# Patient Record
Sex: Male | Born: 1980 | Race: White | Hispanic: No | Marital: Single | Smoking: Current some day smoker
Health system: Southern US, Community
[De-identification: ages and names within clinical notes are randomized; demographics above are authoritative.]

## PROBLEM LIST (undated history)

## (undated) DIAGNOSIS — N200 Calculus of kidney: Secondary | ICD-10-CM

---

## 2004-09-05 ENCOUNTER — Emergency Department: Payer: Self-pay | Admitting: Internal Medicine

## 2004-09-23 ENCOUNTER — Ambulatory Visit: Payer: Self-pay | Admitting: General Practice

## 2004-10-22 ENCOUNTER — Ambulatory Visit: Payer: Self-pay | Admitting: General Practice

## 2006-01-29 ENCOUNTER — Emergency Department: Payer: Self-pay | Admitting: Unknown Physician Specialty

## 2007-03-14 IMAGING — CT CT STONE STUDY
1 of 2 series · 15 of 32 positions shown, 19 images · non-contrast
Comparison: none

REASON FOR EXAM: left flank pain///pt in subwait
COMMENTS:

[Series 2: stone · axial · 0.69mm/px · z∈[-834,-444]mm · 15 of 147 slices shown, 19 images]
[im 11/147  soft-tissue]
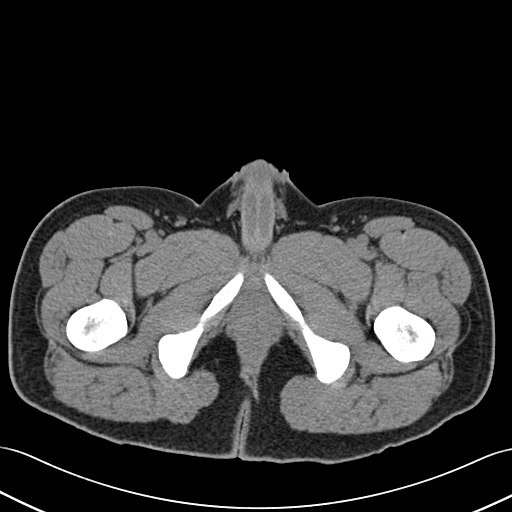
[im 11/147  bone]
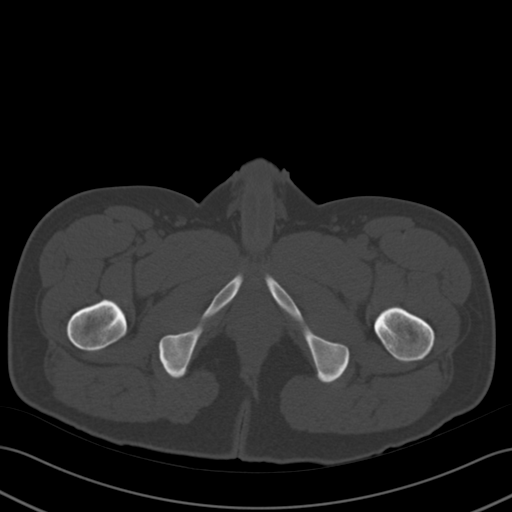
[im 21/147  soft-tissue]
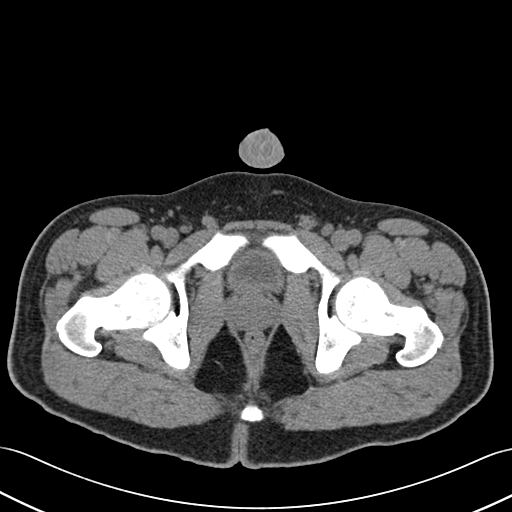
[im 32/147  soft-tissue]
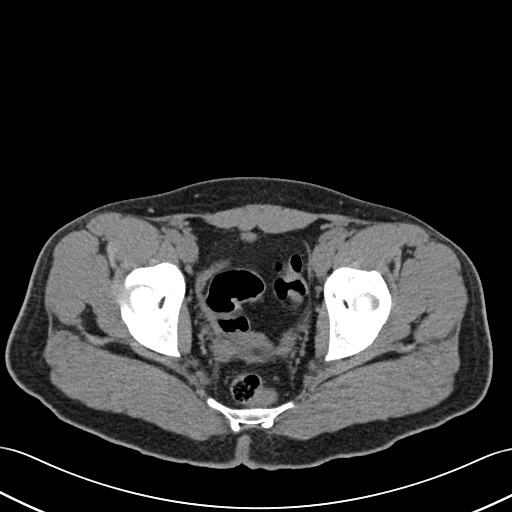
[im 42/147  soft-tissue]
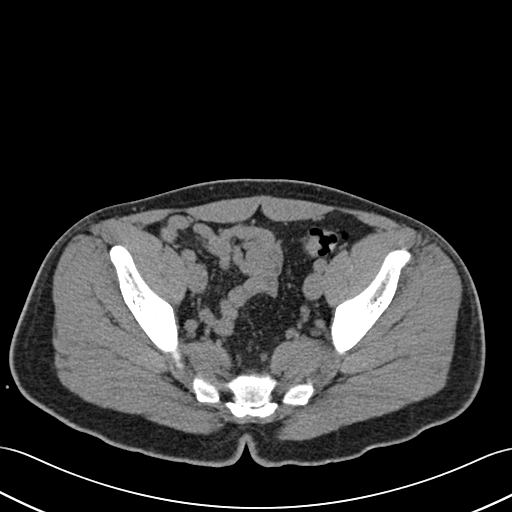
[im 53/147  soft-tissue]
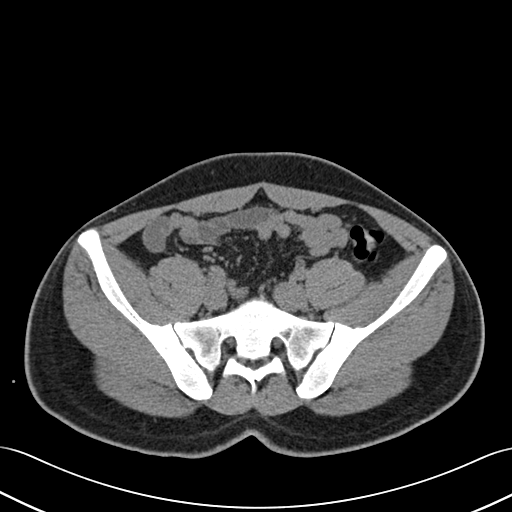
[im 63/147  soft-tissue]
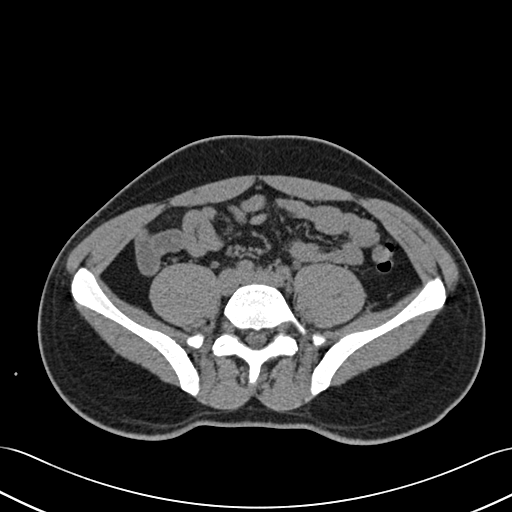
[im 74/147  soft-tissue]
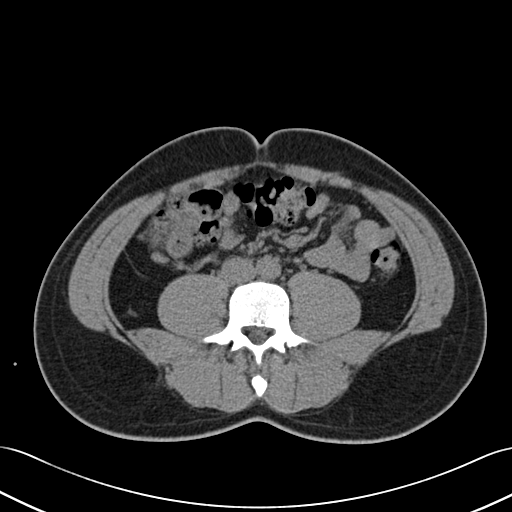
[im 84/147  soft-tissue]
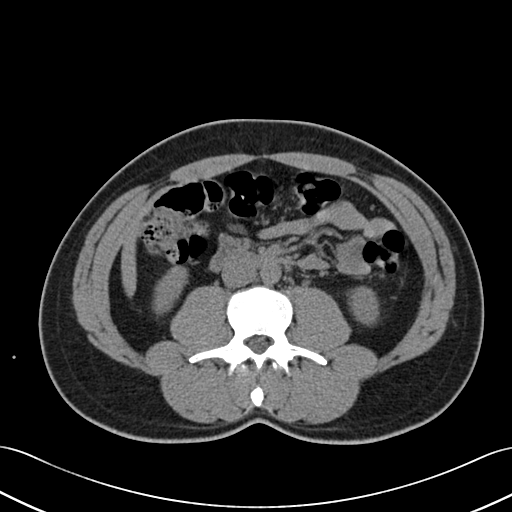
[im 94/147  soft-tissue]
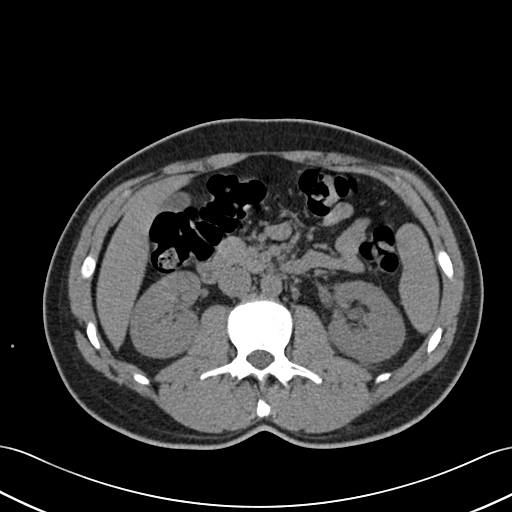
[im 94/147  bone]
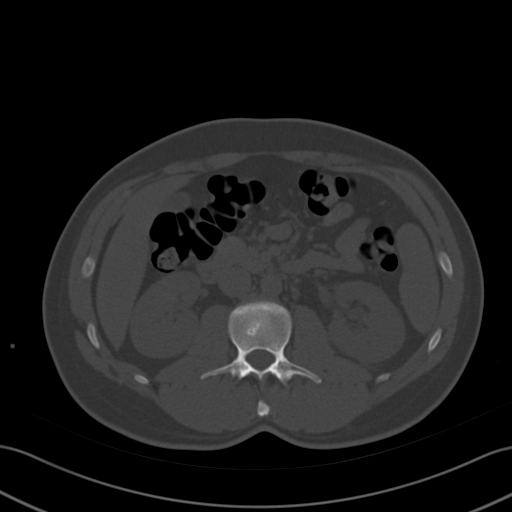
[im 105/147  soft-tissue]
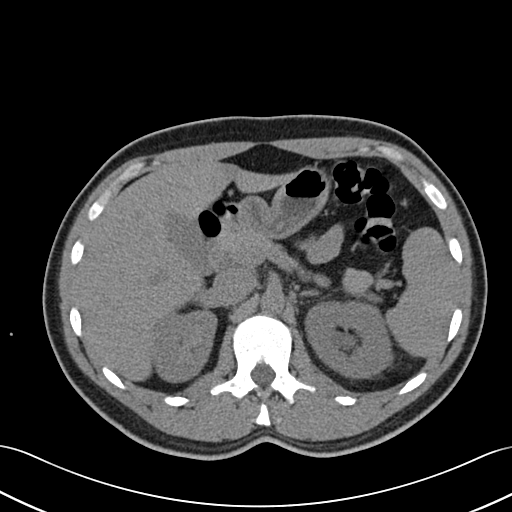
[im 115/147  soft-tissue]
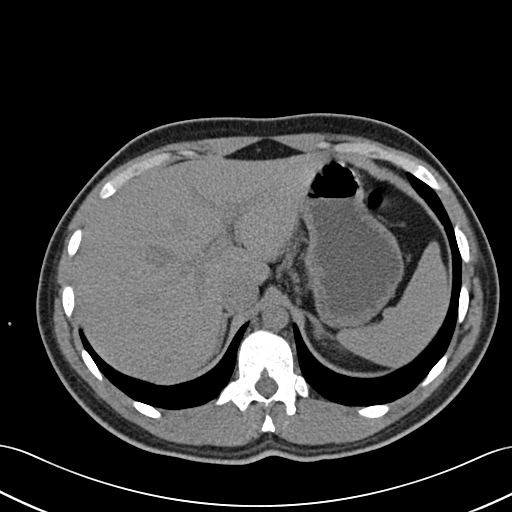
[im 126/147  soft-tissue]
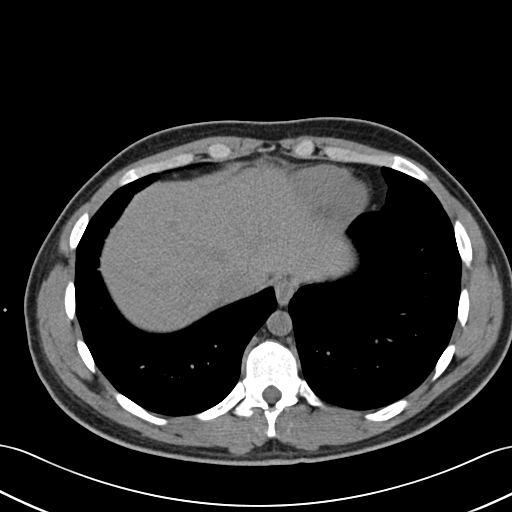
[im 126/147  lung]
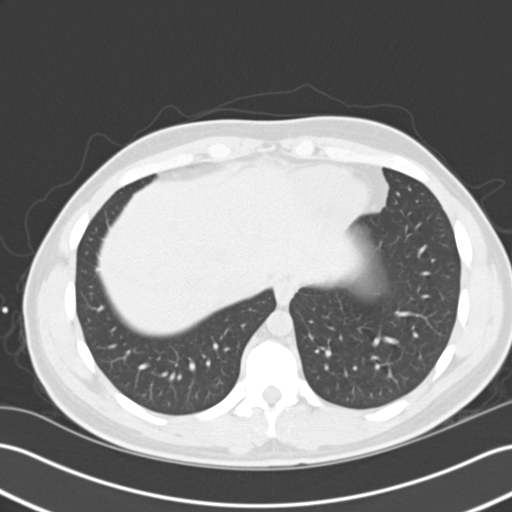
[im 131/147  lung]
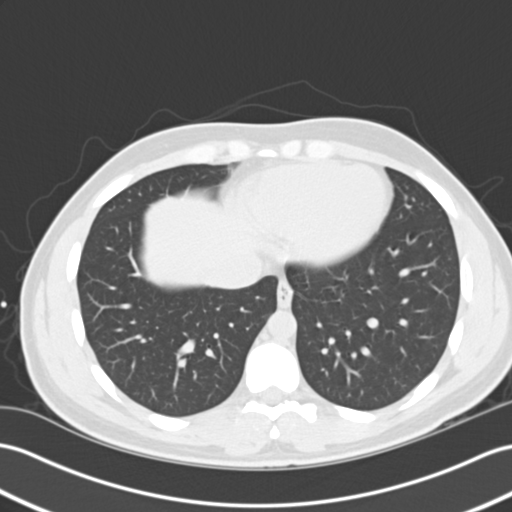
[im 136/147  soft-tissue]
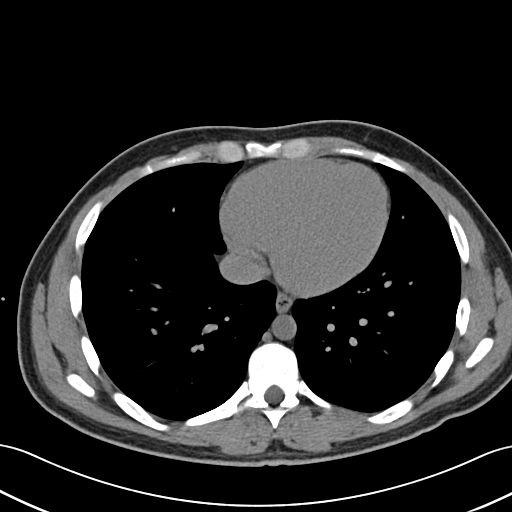
[im 136/147  lung]
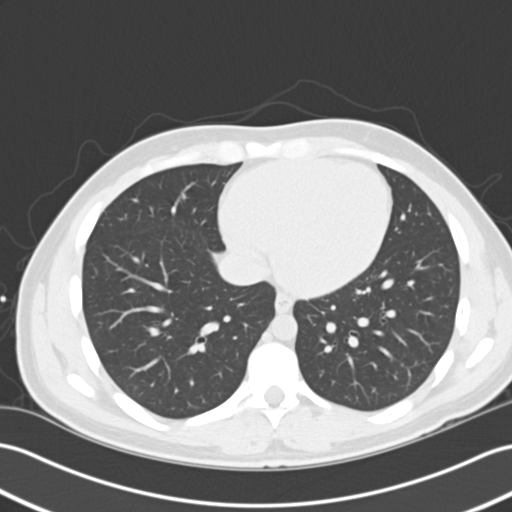
[im 141/147  lung]
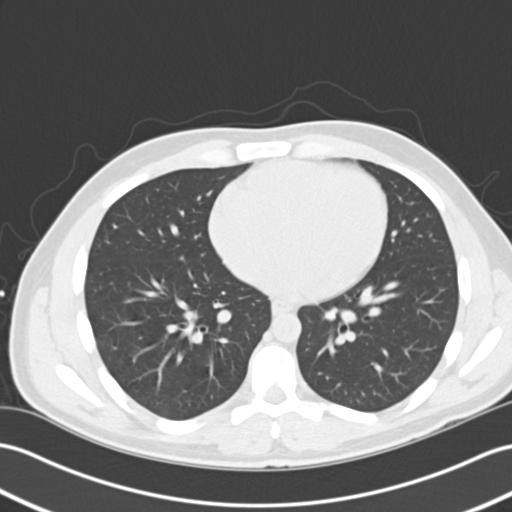

[15 of 32 positions shown; findings below may reference images not displayed]

PROCEDURE:     CT  - CT ABDOMEN /PELVIS WO (STONE)  - January 29, 2006  [DATE]

RESULT:     Helical non-contrast at 3 mm sections were obtained from the
lung bases through the pubic symphysis.

Evaluation of the lung bases demonstrates no gross abnormalities. Evaluation
of the LEFT kidney demonstrates pelvocaliectasis/mild hydronephrosis.  There
is mild dilatation of the LEFT ureter.  Within the LEFT ureter at the level
of the proximal ureterovesical junction a 3.3 mm calcified density projects
indicative of a distal LEFT ureteral calculus.

Evaluation of the RIGHT collecting system demonstrates no evidence of
hydroureter nor ureterolithiasis. There is no evidence of hydronephrosis.
Small punctuate areas of calcified flecks project in the region of the
corticomedullary portion of the RIGHT kidney possibly representing very
minute areas of calcification.  The remaining non-contrast solid abdominal
viscera are unremarkable. There is no evidence of abdominal or pelvic free
fluid, drainable loculated fluid collections, masses nor adenopathy.
IMPRESSION: 1)Findings consistent with a distal LEFT ureteral calculus at the level of
the proximal ureterovesical junction.  There is mild associated obstructive
uropathy.

2)Dr. Jacko of the Emergency Room was informed of these findings at the time
of the initial interpretation.

## 2008-03-01 ENCOUNTER — Emergency Department: Payer: Self-pay | Admitting: Emergency Medicine

## 2010-07-28 ENCOUNTER — Ambulatory Visit: Payer: Self-pay | Admitting: General Practice

## 2011-09-10 IMAGING — CT CT HEAD WITHOUT CONTRAST
2 series · 16 of 30 positions shown, 20 images · non-contrast
Comparison: none

REASON FOR EXAM: STAT CR [id number] nausea dizziness blurred vision
following fall last night
COMMENTS:

PROCEDURE:     CT  - CT HEAD WITHOUT CONTRAST  - July 28, 2010 [DATE]
RESULT:     Comparison:  None
TECHNIQUE: Multiple axial images from the foramen magnum to the vertex were
obtained without IV contrast.

[Series 2: without · axial · non-contrast · 0.43mm/px · z∈[+920,+1050]mm · 13 of 32 slices shown, 17 images]
[im 3/32  brain]
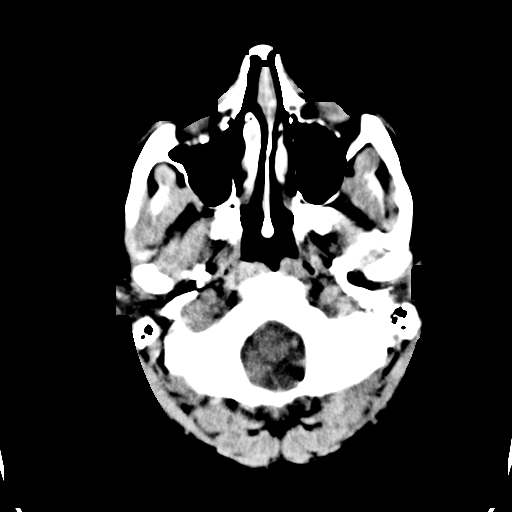
[im 3/32  bone]
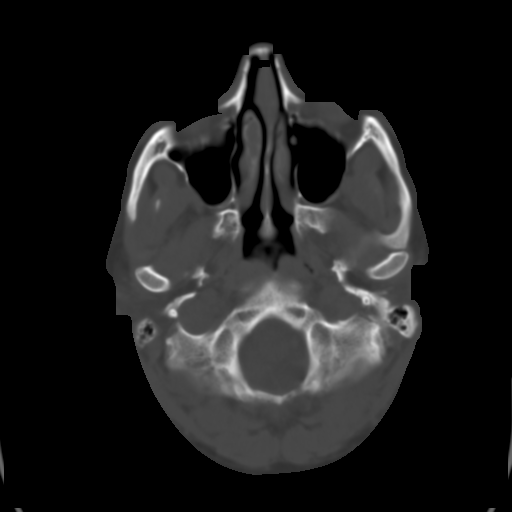
[im 5/32  brain]
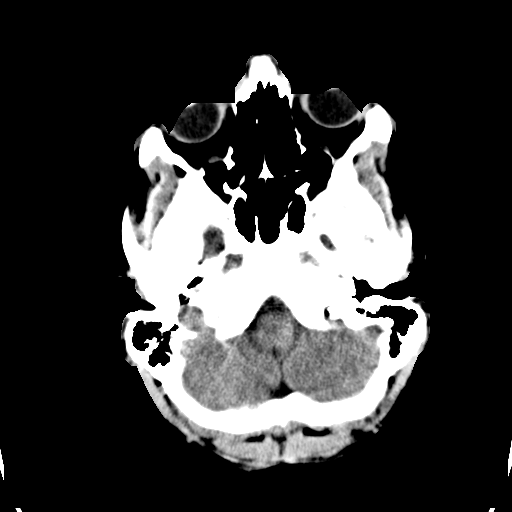
[im 7/32  brain]
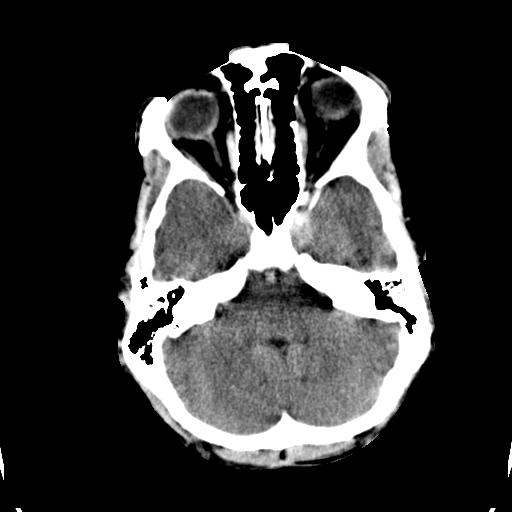
[im 9/32  brain]
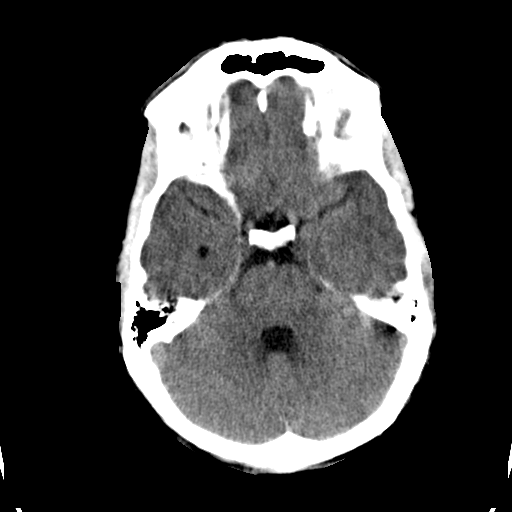
[im 12/32  brain]
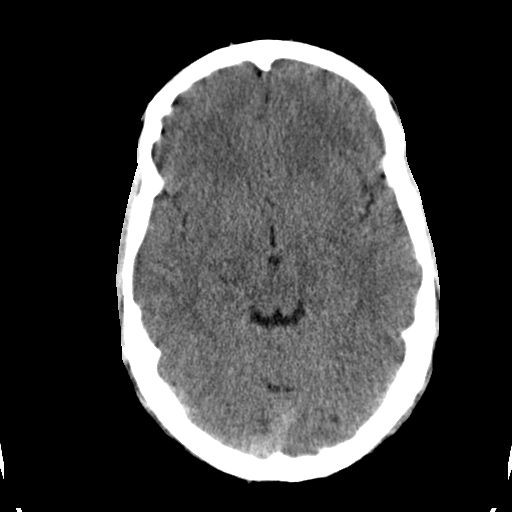
[im 12/32  bone]
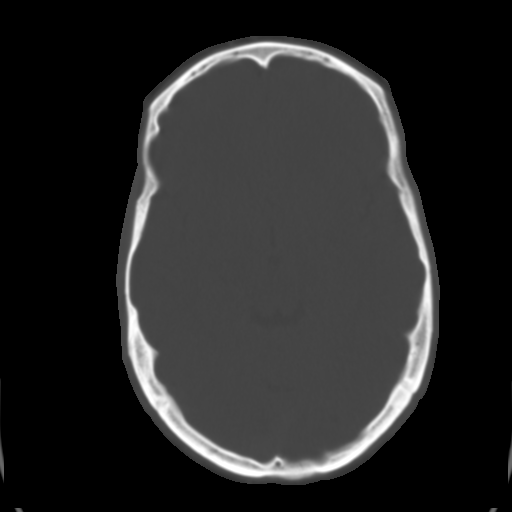
[im 14/32  brain]
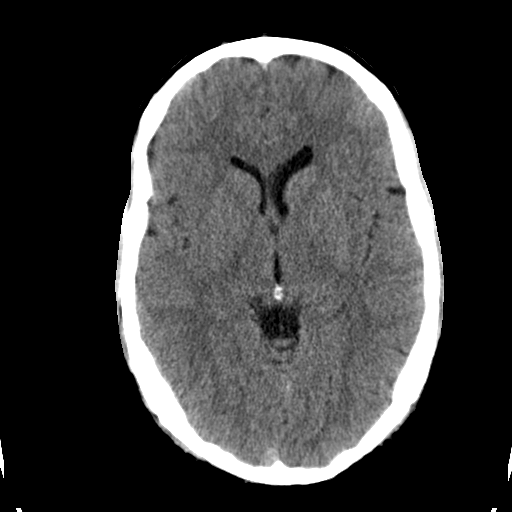
[im 16/32  brain]
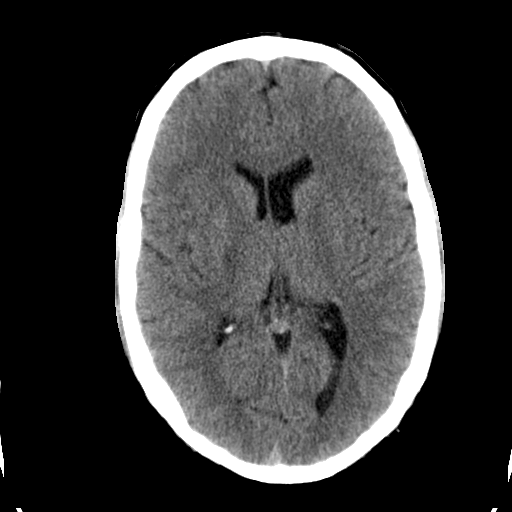
[im 18/32  brain]
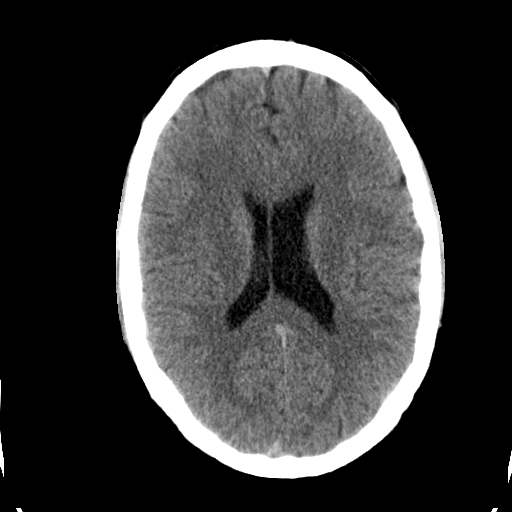
[im 20/32  brain]
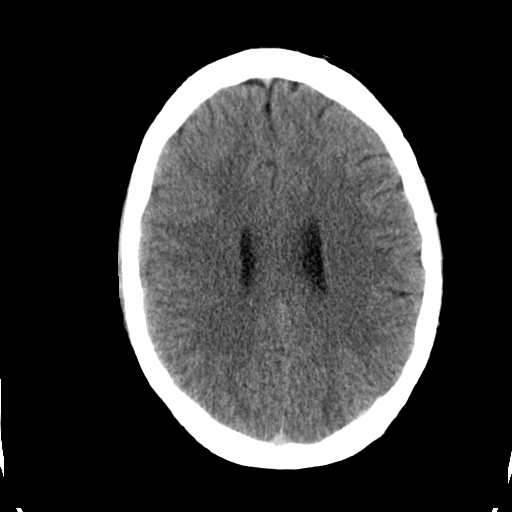
[im 20/32  bone]
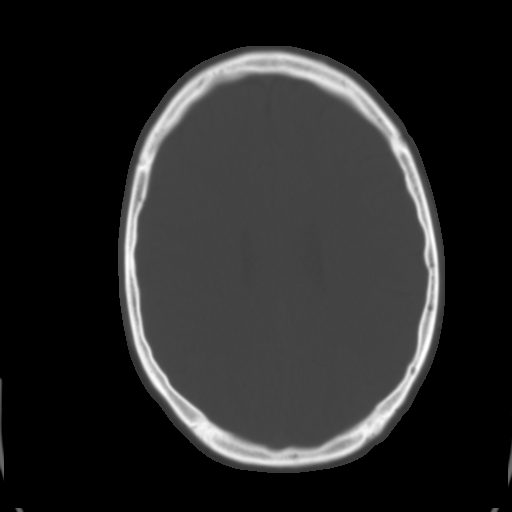
[im 23/32  brain]
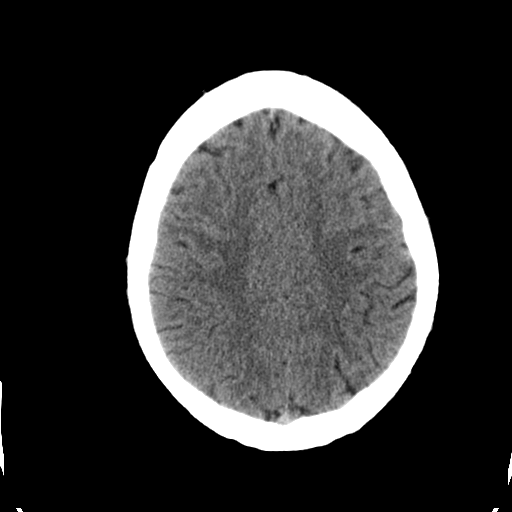
[im 25/32  brain]
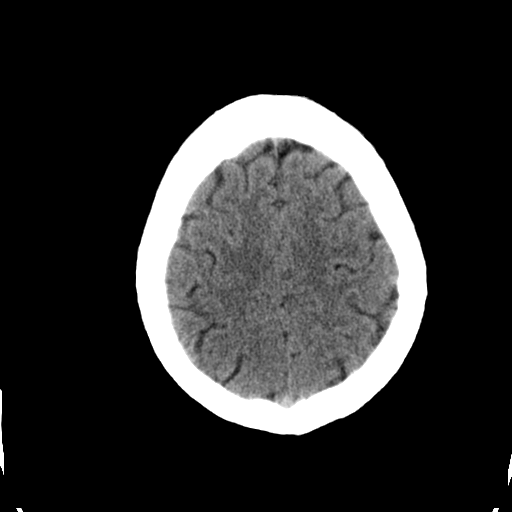
[im 27/32  brain]
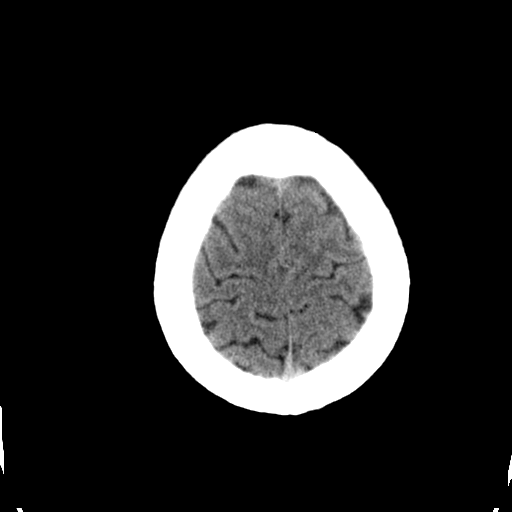
[im 29/32  brain]
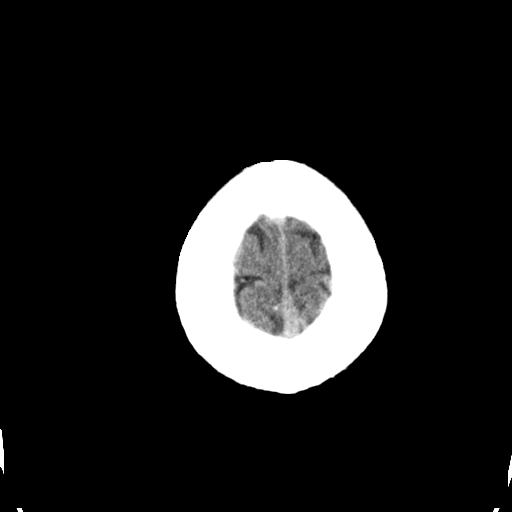
[im 29/32  bone]
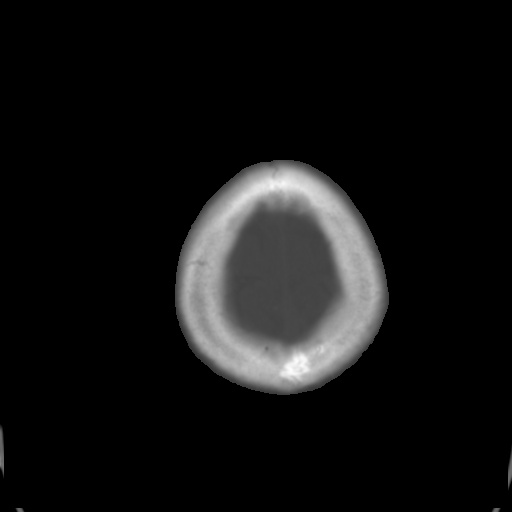

[Series 3: bone · axial · 0.43mm/px · z∈[+920,+965]mm · 3 of 32 slices shown]
[im 3/32  bone]
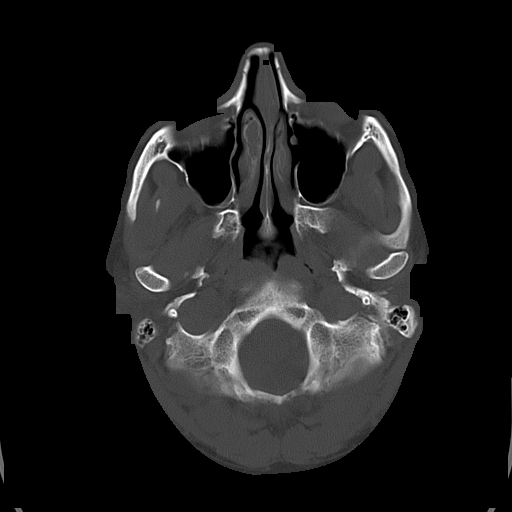
[im 7/32  bone]
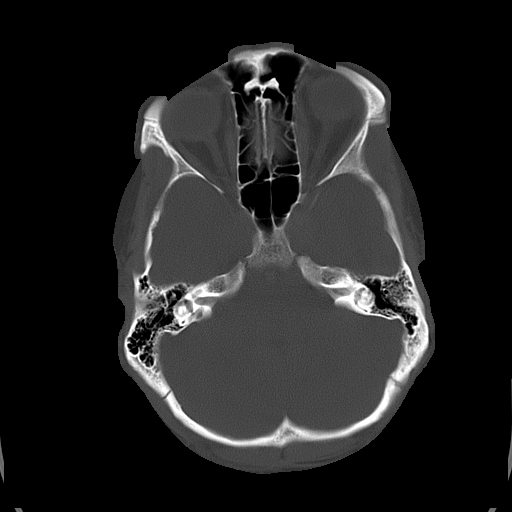
[im 12/32  bone]
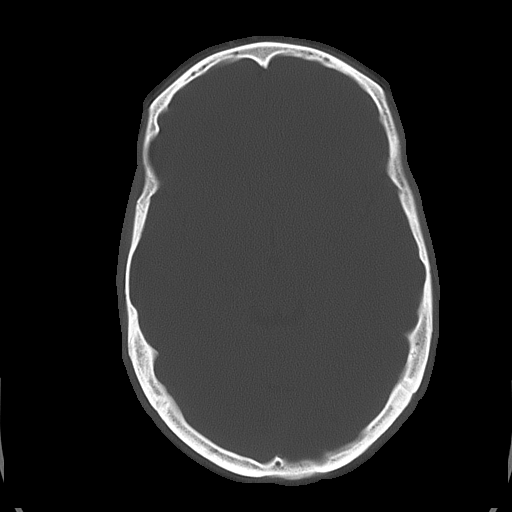

[16 of 30 positions shown; findings below may reference images not displayed]

FINDINGS: There is no evidence of mass effect, midline shift, or extra-axial fluid
collections.  There is no evidence of a space-occupying lesion or
intracranial hemorrhage. There is no evidence of a cortical-based area of
acute infarction.

The ventricles and sulci are appropriate for the patient's age. The basal
cisterns are patent.

Visualized portions of the orbits are unremarkable. The visualized portions
of the paranasal sinuses and mastoid air cells are unremarkable.

The osseous structures are unremarkable.
IMPRESSION: No acute intracranial process.

## 2015-07-22 ENCOUNTER — Encounter: Payer: Self-pay | Admitting: Physician Assistant

## 2015-07-22 ENCOUNTER — Ambulatory Visit: Payer: Self-pay | Admitting: Physician Assistant

## 2015-07-22 VITALS — BP 121/70 | HR 67 | Temp 97.9°F

## 2015-07-22 DIAGNOSIS — J069 Acute upper respiratory infection, unspecified: Secondary | ICD-10-CM

## 2015-07-22 DIAGNOSIS — R509 Fever, unspecified: Secondary | ICD-10-CM

## 2015-07-22 LAB — POCT INFLUENZA A/B
INFLUENZA A, POC: NEGATIVE
Influenza B, POC: NEGATIVE

## 2015-07-22 MED ORDER — METHYLPREDNISOLONE 4 MG PO TBPK: ORAL_TABLET | ORAL | Status: AC

## 2015-07-22 MED ORDER — CEFDINIR 300 MG PO CAPS: 300.0000 mg | ORAL_CAPSULE | Freq: Two times a day (BID) | ORAL | Status: AC

## 2015-07-22 NOTE — Progress Notes (Signed)
S: C/o runny nose and congestion with dry cough for 6 days, + fever, chills, night sweats; denies cp/sob, v/d; mucus was yellow , started as sinus like sx but now its in his chest, + body aches  Using otc meds: robitussin  O: PE: vitals wnl, nad,  perrl eomi, normocephalic, tms dull, nasal mucosa red and swollen, throat injected, neck supple no lymph, lungs c t a, cv rrr, neuro intact, cough is dry and hacking; flu swab neg  A:  Acute uri   P: omnicef 300mg  bid , medrol dose packdrink fluids, continue regular meds , use otc meds of choice, return if not improving in 5 days, return earlier if worsening

## 2015-10-15 ENCOUNTER — Encounter: Payer: Self-pay | Admitting: *Deleted

## 2015-10-15 ENCOUNTER — Emergency Department
Admission: EM | Admit: 2015-10-15 | Discharge: 2015-10-16 | Disposition: A | Discharge status: Home / Self Care | Payer: Worker's Compensation | Attending: Student | Admitting: Student

## 2015-10-15 DIAGNOSIS — S0081XA Abrasion of other part of head, initial encounter: Secondary | ICD-10-CM | POA: Diagnosis not present

## 2015-10-15 DIAGNOSIS — Y9389 Activity, other specified: Secondary | ICD-10-CM | POA: Diagnosis not present

## 2015-10-15 DIAGNOSIS — Z7721 Contact with and (suspected) exposure to potentially hazardous body fluids: Secondary | ICD-10-CM | POA: Diagnosis not present

## 2015-10-15 DIAGNOSIS — S60812A Abrasion of left wrist, initial encounter: Secondary | ICD-10-CM | POA: Insufficient documentation

## 2015-10-15 DIAGNOSIS — Y929 Unspecified place or not applicable: Secondary | ICD-10-CM | POA: Insufficient documentation

## 2015-10-15 DIAGNOSIS — Y990 Civilian activity done for income or pay: Secondary | ICD-10-CM | POA: Insufficient documentation

## 2015-10-15 MED ORDER — AMOXICILLIN-POT CLAVULANATE 875-125 MG PO TABS: 1.0000 | ORAL_TABLET | Freq: Two times a day (BID) | ORAL | Status: AC

## 2015-10-15 MED ORDER — TETANUS-DIPHTH-ACELL PERTUSSIS 5-2.5-18.5 LF-MCG/0.5 IM SUSP
0.5000 mL | Freq: Once | INTRAMUSCULAR | Status: AC
  Administered 2015-10-15: 0.5 mL via INTRAMUSCULAR
  Filled 2015-10-15: qty 0.5
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
## 2015-10-16 NOTE — ED Notes (Signed)
Blood drawn for [facility]

## 2015-10-16 NOTE — ED Notes (Signed)
Walked [facility] blood to Lab

## 2016-05-03 ENCOUNTER — Encounter: Payer: Self-pay | Admitting: Physician Assistant

## 2016-05-03 ENCOUNTER — Ambulatory Visit: Payer: Self-pay | Admitting: Physician Assistant

## 2016-05-03 VITALS — BP 136/94 | HR 92 | Temp 98.3°F

## 2016-05-03 DIAGNOSIS — J0100 Acute maxillary sinusitis, unspecified: Secondary | ICD-10-CM

## 2016-05-03 MED ORDER — AMOXICILLIN-POT CLAVULANATE 875-125 MG PO TABS: 1.0000 | ORAL_TABLET | Freq: Two times a day (BID) | ORAL | 0 refills | Status: DC

## 2016-05-03 MED ORDER — METHYLPREDNISOLONE 4 MG PO TBPK: ORAL_TABLET | ORAL | 0 refills | Status: DC

## 2016-05-03 NOTE — Progress Notes (Signed)
S: C/o runny nose and congestion for 5 days, no fever, chills, cp/sob, v/d; mucus is green and thick, cough is sporadic, c/o of facial and dental pain. Some left ear pain  Using otc meds:   O: PE: vitals wnl, nad,  perrl eomi, normocephalic, tms dull, left is red; nasal mucosa red and swollen, throat injected, neck supple no lymph, lungs c t a, cv rrr, neuro intact  A:  Acute sinusitis   P: drink fluids, continue regular meds , use otc meds of choice, return if not improving in 5 days, return earlier if worsening , augmentin 875 bid x 10d, medrol dose pack

## 2016-08-26 ENCOUNTER — Ambulatory Visit: Payer: Self-pay | Admitting: Physician Assistant

## 2016-08-26 VITALS — BP 121/79 | HR 69 | Temp 98.1°F

## 2016-08-26 DIAGNOSIS — R21 Rash and other nonspecific skin eruption: Secondary | ICD-10-CM

## 2016-08-26 MED ORDER — DEXAMETHASONE SODIUM PHOSPHATE 10 MG/ML IJ SOLN
10.0000 mg | Freq: Once | INTRAMUSCULAR | Status: AC
  Administered 2016-08-26: 10 mg via INTRAMUSCULAR

## 2016-08-26 MED ORDER — CLOTRIMAZOLE-BETAMETHASONE 1-0.05 % EX CREA: 1.0000 "application " | TOPICAL_CREAM | Freq: Two times a day (BID) | CUTANEOUS | 0 refills | Status: DC

## 2016-08-26 NOTE — Progress Notes (Signed)
S: c/o itchy foot, legs, waist,  was outside in yard and then broke out, sx for few days, tried multiple otc meds without relief, denies fever/chills, ? If from working out at the gym  O: vitals wnl, nad, lungs c t a, cv rrr, skin with small raised red areas some with streaks/blisters, no drainage, n/v intact  A: acute contact dermatitis  P: decadron  IM, lotrisone cream

## 2016-09-12 ENCOUNTER — Telehealth: Payer: Self-pay | Admitting: Emergency Medicine

## 2016-09-12 MED ORDER — PREDNISONE 10 MG (21) PO TBPK: ORAL_TABLET | ORAL | 0 refills | Status: DC

## 2016-09-12 NOTE — Telephone Encounter (Signed)
Patient called and expressed that he continues to have the rash.  The rash is located on his ankles, neck and arms. Patient uses [facility] on [address]

## 2016-09-12 NOTE — Telephone Encounter (Signed)
Pt still itching, ? If could do steroid pack

## 2016-10-05 ENCOUNTER — Ambulatory Visit: Payer: Self-pay | Admitting: Physician Assistant

## 2017-05-26 ENCOUNTER — Encounter: Payer: Self-pay | Admitting: Physician Assistant

## 2017-05-26 ENCOUNTER — Ambulatory Visit: Payer: Self-pay | Admitting: Physician Assistant

## 2017-05-26 ENCOUNTER — Ambulatory Visit: Payer: Self-pay

## 2017-05-26 DIAGNOSIS — J0120 Acute ethmoidal sinusitis, unspecified: Secondary | ICD-10-CM

## 2017-05-26 MED ORDER — AMOXICILLIN 875 MG PO TABS: 875.0000 mg | ORAL_TABLET | Freq: Two times a day (BID) | ORAL | 0 refills | Status: DC

## 2017-05-26 MED ORDER — FEXOFENADINE-PSEUDOEPHED ER 60-120 MG PO TB12: 1.0000 | ORAL_TABLET | Freq: Two times a day (BID) | ORAL | 0 refills | Status: DC

## 2017-05-26 NOTE — Progress Notes (Signed)
   Subjective: Sinus congestion    Patient ID: Rudi RummageMichael Mcglothlin, male    DOB: [birth year], 37 y.o.   MRN: [id number]  HPI Patient complaining 1 week of intermittent sinus congestion and rhinorrhea.  Patient states copious postnasal drainage.  Patient also has bilateral ear pressure.  Patient denies fever chills.  Patient a cough secondary to postnasal drainage.  No relief with over-the-counter preparations.   Review of Systems     Negative except for complaint Objective:   Physical Exam  HEENT remarkable edematous nasal turbinates with thick rhinorrhea.  Bilateral maxillary guarding.  Erythematous pharynx postnasal drainage.  Neck is supple without adenopathy.  Lungs are clear to auscultation heart regular rate and rhythm.      Assessment & Plan: Sinusitis  Patient given discharge care instruction advised take medications as directed.  Patient advised to follow-up 1 week if no improvement.

## 2017-07-12 ENCOUNTER — Ambulatory Visit: Payer: Self-pay | Admitting: Family Medicine

## 2017-07-12 ENCOUNTER — Encounter: Payer: Self-pay | Admitting: Family Medicine

## 2017-07-12 VITALS — BP 120/88 | HR 100 | Temp 98.2°F | Resp 20 | Ht 67.0 in | Wt 172.0 lb

## 2017-07-12 DIAGNOSIS — Z0189 Encounter for other specified special examinations: Principal | ICD-10-CM

## 2017-07-12 DIAGNOSIS — Z008 Encounter for other general examination: Secondary | ICD-10-CM

## 2017-07-12 LAB — GLUCOSE, POCT (MANUAL RESULT ENTRY): POC GLUCOSE: 159 mg/dL — AB (ref 70–99)

## 2017-07-12 NOTE — Progress Notes (Signed)
Subjective: Annual biometrics screening  Patient presents for their annual biometric screening. Patient denies any medical problems.  Does not currently have a primary care provider.  Is not taking any medications. 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 Patient denies any other issues or concerns.   Review of Systems Constitutional: Unremarkable.  HEENT: Denies dizziness, issues with hearing, vision problems.  Gastrointestinal: Denies issues with bowel or bladder.  Respiratory: Unremarkable.   Cardiovascular: Unremarkable.  Musculoskeletal: No significant arthralgias, myalgias, joint swelling, joint stiffness, back pain, neck pain. ROS otherwise negative.   Objective  Physical Exam General: Awake, alert and oriented. No acute distress. Well developed, hydrated and nourished. Appears stated age.  HEENT: Supple neck without adenopathy. Sclera is non-icteric. The ear canal is clear without discharge. The tympanic membrane is normal in appearance with normal landmarks and cone of light. Nasal mucosa is pink and moist. Oral mucosa is pink and moist. The pharynx is normal in appearance without tonsillar swelling or exudates.  Skin: Skin in warm, dry and intact without rashes or lesions. Appropriate color for ethnicity. Cardiac: Heart rate and rhythm are normal. No murmurs, gallops, or rubs are auscultated.  Respiratory: The chest wall is symmetric and without deformity. No signs of respiratory distress. Lung sounds are clear in all lobes bilaterally without rales, ronchi, or wheezes.  Abdominal: Abdomen is soft, symmetric, and non-tender without distention. No masses, hepatomegaly, or splenomegaly are noted.  Spine: Neck and back are without deformity. Curvature of the cervical, thoracic, and lumbar spine are within normal limits. Posture is upright, gait is smooth, steady, and within normal limits. No tenderness noted on palpation of the spinous processes. Spinous processes are  midline. No discomfort is noted with flexion, extension, and side-to-side rotation of the cervical/thoracic/lumbar spine, full range of motion is noted. Grip strength is normal bilaterally.  Extremities: Full range of motion is noted to all major joints. Steady gait noted.  Neurological: The patient is awake, alert and oriented to person, place, and time with normal speech.  Memory is normal and thought processes intact. No gait abnormalities are appreciated.  Psychiatric: Appropriate mood and affect.   Assessment Annual biometrics screen  Plan  Labs pending. Encouraged routine visits with primary care provider.  Provided patient with resources for local primary care providers accepting new patients and recommended that he establish care soon. Nonfasting blood sugar 159, point-of-care.  Patient had just finished drinking a large soda before the this was tested.

## 2017-07-13 LAB — LIPID PANEL
CHOLESTEROL TOTAL: 285 mg/dL — AB (ref 100–199)
Chol/HDL Ratio: 6.1 ratio — ABNORMAL HIGH (ref 0.0–5.0)
HDL: 47 mg/dL (ref >39)
LDL Calculated: 171 mg/dL — ABNORMAL HIGH (ref 0–99)
Triglycerides: 336 mg/dL — ABNORMAL HIGH (ref 0–149)
VLDL Cholesterol Cal: 67 mg/dL — ABNORMAL HIGH (ref 5–40)

## 2017-07-13 NOTE — Progress Notes (Signed)
Carollee HerterShannon, Will you please call this patient and inform him of his lab results?  Inform him that his total cholesterol is elevated at 285, normal values are between 100 and 199.  His triglycerides are elevated at 336, normal values are between 0 and 149.  His LDL cholesterol "bad cholesterol" is elevated at 171, normal values are between 0 and 99.  His VLDL cholesterol is elevated at 67, normal values are between 5 and 40.  These elevations increase his cholesterol/HDL ratio to 6.1, normal is between 0 and 5.  These elevations increase his risk for cardiovascular disease.  Please advise him to discuss these with his primary care provider.

## 2019-01-14 ENCOUNTER — Ambulatory Visit: Payer: Managed Care, Other (non HMO) | Admitting: Adult Health

## 2019-01-14 ENCOUNTER — Other Ambulatory Visit: Payer: Self-pay

## 2019-01-14 ENCOUNTER — Encounter: Payer: Self-pay | Admitting: Adult Health

## 2019-01-14 VITALS — BP 108/60 | HR 62 | Temp 98.1°F | Resp 16 | Ht 67.0 in | Wt 172.0 lb

## 2019-01-14 DIAGNOSIS — Z008 Encounter for other general examination: Secondary | ICD-10-CM

## 2019-01-14 DIAGNOSIS — Z0189 Encounter for other specified special examinations: Secondary | ICD-10-CM | POA: Diagnosis not present

## 2019-01-14 NOTE — Progress Notes (Signed)
[facility]  Jason RummageMichael Terry DOB: 38 y.o. MRN: [id number]  Subjective:  Here for Biometric Screen/brief exam Patient is a 38 year old male in no acute distress who comes to the clinic for his brief biometric exam and glucose and lipid labs.    He denies any concerns for today's visit.  He does exercise and ties to eat healthy however he knows that he has room for improvement./  He uses smokeless tobacco he has for " years".  Non smoker - never has.  Denies alcohol  or any recreational drug use.  Patient  denies any fever, body aches,chills, rash, chest pain, shortness of breath, nausea, vomiting, or diarrhea.   He does not have a primary care provider however knows he needs to obtain one.   Objective: Blood pressure 108/60, pulse 62, temperature 98.1 F (36.7 C), resp. rate 16, weight 172 lb (78 kg), SpO2 99 %.  NAD, well developed, well nourished  HEENT: Within normal limits Neck: Normal, supple, thyroid normal, cervical lymphadenopathy  Heart: Regular rate and rhythm no murmur, rubs or gallops  Lungs: Clear to ausculation without any adventitious lung sounds   Assessment: Biometric screen 1. Encounter for other general examination- not a full physical/ brief biometric exam with glucose and lipiids    2. Encounter for biometric screening      Plan:   Fasting glucose and lipids. Discussed with patient that today's visit here is a limited biometric screening visit (not a comprehensive exam or management of any chronic problems) Discussed some health issues, including healthy eating habits and exercise. Encouraged to follow-up with PCP for annual comprehensive preventive and wellness care (and if applicable, any chronic issues). Questions invited and answered.   I will have the office call you on your glucose and cholesterol results when they return if you have not heard within 1 week please call the office.  This biometric  physical is a brief physical and the only labs done are glucose and your lipid panel(cholesterol) and is  not a substitute for seeing a primary care provider for a complete annual physical. Please see a primary care physician for routine health maintenance, labs and full physical at least yearly and follow up as recommended by your provider. Provider also recommends if you do not have a primary care provider for patient to establish care as soon as possible .Patient may chose provider of choice. Also gave the [place]  PHYSICIAN/PROVIDER  REFERRAL LINE at [phone] or web site at [email or web address] to help assist with finding a primary care doctor.  Patient verbalizes understanding that his office is acute care only and not a substitute for a primary care or for the management of chronic conditions.   The Biometric exam is a brief physical with labs including glucose and cholesterol. This does not replace a full physical with a primary care provider, and additional recommended labs. This is an acute care clinic not for maintenance of chronic or long standing conditions.   Provider also recommends if you do not have a primary care provider for patient to establish care promptly.You can choose any provider of your choice at any facility of your choice, the below information is  just a resource to aid in you finding a primary care provider for routine health maintenance.   [place]  PHYSICIAN/PROVIDER  REFERRAL LINE at [phone]  [email or web address] to help assist with finding a primary care doctor.   Helpful resources below of  other primary care office's accepting new patients.   [facility]         [address]  [place]. [place] [address] [phone]  . [facility]    [address] [place], [place] [address] [phone]  . [facility] [address] [place], [place]  [phone]   . [place] [facility]  [address], [address] [place] [address] [phone]    Follow up with primary care as needed for chronic and maintenance health care- can be seen in this employee clinic for acute care.

## 2019-01-14 NOTE — Patient Instructions (Signed)
The Biometric exam is a brief physical with labs including glucose and cholesterol. This does not replace a full physical with a primary care provider, and additional recommended labs. This is an acute care clinic not for maintenance of chronic or long standing conditions.   Provider also recommends if you do not have a primary care provider for patient to establish care promptly.You can choose any provider of your choice at any facility of your choice, the below information is  just a resource to aid in you finding a primary care provider for routine health maintenance.   [place]  PHYSICIAN/PROVIDER  REFERRAL LINE at 1-800-449- 8688  [email or web address] to help assist with finding a primary care doctor.   Helpful resources below of other primary care office's accepting new patients.   . [facility]         [address]  [place] [address] [phone]  . [facility]    [address], [address] [place], [place] [address] [phone]  . [facility] [address]  [place], [place]  [phone]   . [facility]  [address], [address]  [place] [address] [phone]    Follow up with primary care as needed for chronic and maintenance health care- can be seen in this employee clinic for acute care.     I will have the office call you on your glucose and cholesterol results when they return if you have not heard within 1 week please call the office.  This biometric physical is a brief physical and the only labs done are glucose and your lipid panel(cholesterol) and is  not a substitute for seeing a primary care provider for a complete annual physical. Please see a primary care physician for routine health maintenance, labs and full physical at least yearly and follow up as recommended by your provider. Provider also recommends if you do not have a primary care  provider for patient to establish care as soon as possible .Patient may chose provider of choice. Also gave the [place]  PHYSICIAN/PROVIDER  REFERRAL LINE at 1-800-449- 8688 or web site at .COM to help assist with finding a primary care doctor.  Patient verbalizes understanding that his office is acute care only and not a substitute for a primary care or for the management of chronic conditions.    Health Maintenance, Male Adopting a healthy lifestyle and getting preventive care are important in promoting health and wellness. Ask your health care provider about:  The right schedule for you to have regular tests and exams.  Things you can do on your own to prevent diseases and keep yourself healthy. What should I know about diet, weight, and exercise? Eat a healthy diet   Eat a diet that includes plenty of vegetables, fruits, low-fat dairy products, and lean protein.  Do not eat a lot of foods that are high in solid fats, added sugars, or sodium. Maintain a healthy weight Body mass index (BMI) is a measurement that can be used to identify possible weight problems. It estimates body fat based on height and weight. Your health care provider can help determine your BMI and help you achieve or maintain a healthy weight. Get regular exercise Get regular exercise. This is one of the most important things you can do for your health. Most adults should:  Exercise for at least 150 minutes each week. The exercise should increase   your heart rate and make you sweat (moderate-intensity exercise).  Do strengthening exercises at least twice a week. This is in addition to the moderate-intensity exercise.  Spend less time sitting. Even light physical activity can be beneficial. Watch cholesterol and blood lipids Have your blood tested for lipids and cholesterol at 36 years of age, then have this test every 5 years. You may need to have your cholesterol levels checked more often if:  Your  lipid or cholesterol levels are high.  You are older than 36 years of age.  You are at high risk for heart disease. What should I know about cancer screening? Many types of cancers can be detected early and may often be prevented. Depending on your health history and family history, you may need to have cancer screening at various ages. This may include screening for:  Colorectal cancer.  Prostate cancer.  Skin cancer.  Lung cancer. What should I know about heart disease, diabetes, and high blood pressure? Blood pressure and heart disease  High blood pressure causes heart disease and increases the risk of stroke. This is more likely to develop in people who have high blood pressure readings, are of African descent, or are overweight.  Talk with your health care provider about your target blood pressure readings.  Have your blood pressure checked: ? Every 3-5 years if you are 18-39 years of age. ? Every year if you are 40 years old or older.  If you are between the ages of 65 and 75 and are a current or former smoker, ask your health care provider if you should have a one-time screening for abdominal aortic aneurysm (AAA). Diabetes Have regular diabetes screenings. This checks your fasting blood sugar level. Have the screening done:  Once every three years after age 45 if you are at a normal weight and have a low risk for diabetes.  More often and at a younger age if you are overweight or have a high risk for diabetes. What should I know about preventing infection? Hepatitis B If you have a higher risk for hepatitis B, you should be screened for this virus. Talk with your health care provider to find out if you are at risk for hepatitis B infection. Hepatitis C Blood testing is recommended for:  Everyone born from 1945 through 1965.  Anyone with known risk factors for hepatitis C. Sexually transmitted infections (STIs)  You should be screened each year for STIs, including  gonorrhea and chlamydia, if: ? You are sexually active and are younger than 36 years of age. ? You are older than 36 years of age and your health care provider tells you that you are at risk for this type of infection. ? Your sexual activity has changed since you were last screened, and you are at increased risk for chlamydia or gonorrhea. Ask your health care provider if you are at risk.  Ask your health care provider about whether you are at high risk for HIV. Your health care provider may recommend a prescription medicine to help prevent HIV infection. If you choose to take medicine to prevent HIV, you should first get tested for HIV. You should then be tested every 3 months for as long as you are taking the medicine. Follow these instructions at home: Lifestyle  Do not use any products that contain nicotine or tobacco, such as cigarettes, e-cigarettes, and chewing tobacco. If you need help quitting, ask your health care provider.  Do not use street drugs.  Do   not share needles.  Ask your health care provider for help if you need support or information about quitting drugs. Alcohol use  Do not drink alcohol if your health care provider tells you not to drink.  If you drink alcohol: ? Limit how much you have to 0-2 drinks a day. ? Be aware of how much alcohol is in your drink. In the [place] one drink equals one 12 oz bottle of beer (355 mL), one 5 oz glass of wine (148 mL), or one 1 oz glass of hard liquor (44 mL). General instructions  Schedule regular health, dental, and eye exams.  Stay current with your vaccines.  Tell your health care provider if: ? You often feel depressed. ? You have ever been abused or do not feel safe at home. Summary  Adopting a healthy lifestyle and getting preventive care are important in promoting health and wellness.  Follow your health care provider's instructions about healthy diet, exercising, and getting tested or screened for diseases.   Follow your health care provider's instructions on monitoring your cholesterol and blood pressure. This information is not intended to replace advice given to you by your health care provider. Make sure you discuss any questions you have with your health care provider. Document Released: 10/15/2007 Document Revised: 04/11/2018 Document Reviewed: 04/11/2018 Elsevier Patient Education  2020 Elsevier Inc.  

## 2019-01-15 ENCOUNTER — Encounter: Payer: Self-pay | Admitting: Adult Health

## 2019-01-15 LAB — LIPID PANEL WITH LDL/HDL RATIO
Cholesterol, Total: 243 mg/dL — ABNORMAL HIGH (ref 100–199)
HDL: 45 mg/dL (ref >39)
LDL Chol Calc (NIH): 169 mg/dL — ABNORMAL HIGH (ref 0–99)
LDL/HDL Ratio: 3.8 ratio — ABNORMAL HIGH (ref 0.0–3.6)
Triglycerides: 158 mg/dL — ABNORMAL HIGH (ref 0–149)
VLDL Cholesterol Cal: 29 mg/dL (ref 5–40)

## 2019-01-15 LAB — GLUCOSE, RANDOM: Glucose: 79 mg/dL (ref 65–99)

## 2019-12-20 ENCOUNTER — Other Ambulatory Visit: Payer: Self-pay

## 2020-01-08 ENCOUNTER — Telehealth: Payer: Self-pay | Admitting: Oncology

## 2020-02-04 ENCOUNTER — Ambulatory Visit: Payer: Managed Care, Other (non HMO) | Admitting: Physician Assistant

## 2020-02-04 ENCOUNTER — Other Ambulatory Visit: Payer: Self-pay

## 2020-02-04 ENCOUNTER — Encounter: Payer: Managed Care, Other (non HMO) | Admitting: Physician Assistant

## 2020-02-04 VITALS — BP 116/72 | HR 58 | Temp 97.8°F | Resp 16 | Ht 66.0 in | Wt 168.0 lb

## 2020-02-04 DIAGNOSIS — Z008 Encounter for other general examination: Secondary | ICD-10-CM

## 2020-02-04 DIAGNOSIS — Z0000 Encounter for general adult medical examination without abnormal findings: Secondary | ICD-10-CM

## 2020-02-04 NOTE — Progress Notes (Signed)
Subjective:    Patient ID: Jason Terry, male    DOB: [birth year], 39 y.o.   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 Not a die hard athlete but stays active by his report. Has reports he always had a low pulse- no symptoms.  Does not have a PCP- has not had medical care in years. Encouraged to make a connection and establish routine care  He reports that last year he was not fasting-in fact " had a double cheeseburger " on his way in for labs and was called because of high results. Says he is fasting today- overslept his AM appointment but has not had anything to eat or drink. Had [place] steak and cheese from [place] last night at 9 pm Blood drawn today at about 1 pm. Denies family history of lipidemia  Had Covid 1 & 2  Many of his associates have not. Denies any social  interaction outside of work. Likes being alone  Has mild seasonal allergies- uses an occasional Claritin-really  avoids medications unless he feels miserable   Review of Systems Denies any issues except hungry now !    Objective:   Physical Exam Vitals and nursing note reviewed.  Constitutional:      General: He is not in acute distress.    Appearance: Normal appearance.     Comments: BMI 26-27  HENT:     Head: Normocephalic and atraumatic.     Right Ear: Tympanic membrane and external ear normal.     Left Ear: Tympanic membrane and external ear normal.     Ears:     Comments: Cerumen adherent sidewalls , TM partially visible , no erythema    Nose: Nose normal.     Mouth/Throat:     Mouth: Mucous membranes are moist.     Comments: Cobblestoning mild Eyes:     Extraocular Movements: Extraocular movements intact.     Conjunctiva/sclera: Conjunctivae normal.  Cardiovascular:     Rate and Rhythm: Regular rhythm. Bradycardia present.     Pulses: Normal pulses.     Heart  sounds: Normal heart sounds.     Comments: 56 BPM regular no MGR Pulmonary:     Effort: Pulmonary effort is normal.     Breath sounds: Normal breath sounds.  Abdominal:     General: Abdomen is flat. Bowel sounds are normal.     Palpations: Abdomen is soft. There is no mass.     Tenderness: There is no abdominal tenderness.  Genitourinary:    Comments: Denies concerns, exam defered Musculoskeletal:        General: Normal range of motion.     Cervical back: Normal range of motion and neck supple.  Skin:    General: Skin is warm and dry.     Capillary Refill: Capillary refill takes less than 2 seconds.  Neurological:     General: No focal deficit present.     Mental Status: He is alert.  Psychiatric:        Mood and Affect: Mood normal.        Behavior: Behavior normal.       Assessment & Plan:  Encouraged to establish with PCP and given list of possible offices available in the area. 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 My Chart is reported open

## 2020-02-05 LAB — GLUCOSE, RANDOM: Glucose: 91 mg/dL (ref 65–99)

## 2020-02-05 LAB — LIPID PANEL
Chol/HDL Ratio: 5.4 ratio — ABNORMAL HIGH (ref 0.0–5.0)
Cholesterol, Total: 275 mg/dL — ABNORMAL HIGH (ref 100–199)
HDL: 51 mg/dL (ref >39)
LDL Chol Calc (NIH): 196 mg/dL — ABNORMAL HIGH (ref 0–99)
Triglycerides: 149 mg/dL (ref 0–149)
VLDL Cholesterol Cal: 28 mg/dL (ref 5–40)

## 2020-12-18 NOTE — Telephone Encounter (Signed)
error 

## 2022-06-24 ENCOUNTER — Other Ambulatory Visit: Payer: Self-pay

## 2022-06-24 ENCOUNTER — Ambulatory Visit: Payer: Managed Care, Other (non HMO) | Attending: Cardiovascular Disease

## 2022-06-24 ENCOUNTER — Emergency Department
Admission: EM | Admit: 2022-06-24 | Discharge: 2022-06-24 | Disposition: A | Discharge status: Home / Self Care | Payer: Managed Care, Other (non HMO) | Attending: Emergency Medicine | Admitting: Emergency Medicine

## 2022-06-24 DIAGNOSIS — Z20822 Contact with and (suspected) exposure to covid-19: Secondary | ICD-10-CM | POA: Insufficient documentation

## 2022-06-24 DIAGNOSIS — R55 Syncope and collapse: Secondary | ICD-10-CM | POA: Diagnosis present

## 2022-06-24 HISTORY — DX: Calculus of kidney: N20.0

## 2022-06-24 LAB — CBC
HCT: 44.1 % (ref 39.0–52.0)
Hemoglobin: 15.5 g/dL (ref 13.0–17.0)
MCH: 31.9 pg (ref 26.0–34.0)
MCHC: 35.1 g/dL (ref 30.0–36.0)
MCV: 90.7 fL (ref 80.0–100.0)
Platelets: 212 10*3/uL (ref 150–400)
RBC: 4.86 MIL/uL (ref 4.22–5.81)
RDW: 11.9 % (ref 11.5–15.5)
WBC: 5.8 10*3/uL (ref 4.0–10.5)
nRBC: 0 % (ref 0.0–0.2)

## 2022-06-24 LAB — COMPREHENSIVE METABOLIC PANEL
ALT: 31 U/L (ref 0–44)
AST: 25 U/L (ref 15–41)
Albumin: 3.9 g/dL (ref 3.5–5.0)
Alkaline Phosphatase: 33 U/L — ABNORMAL LOW (ref 38–126)
Anion gap: 9 (ref 5–15)
BUN: 18 mg/dL (ref 6–20)
CO2: 26 mmol/L (ref 22–32)
Calcium: 8.6 mg/dL — ABNORMAL LOW (ref 8.9–10.3)
Chloride: 100 mmol/L (ref 98–111)
Creatinine, Ser: 0.9 mg/dL (ref 0.61–1.24)
GFR, Estimated: >60 mL/min (ref >60)
Glucose, Bld: 94 mg/dL (ref 70–99)
Potassium: 3.6 mmol/L (ref 3.5–5.1)
Sodium: 135 mmol/L (ref 135–145)
Total Bilirubin: 0.9 mg/dL (ref 0.3–1.2)
Total Protein: 6.8 g/dL (ref 6.5–8.1)

## 2022-06-24 LAB — RESP PANEL BY RT-PCR (RSV, FLU A&B, COVID)  RVPGX2
Influenza A by PCR: NEGATIVE
Influenza B by PCR: NEGATIVE
Resp Syncytial Virus by PCR: NEGATIVE
SARS Coronavirus 2 by RT PCR: NEGATIVE

## 2022-06-24 LAB — TROPONIN I (HIGH SENSITIVITY): Troponin I (High Sensitivity): 3 ng/L (ref <18)

## 2022-06-24 MED ORDER — SODIUM CHLORIDE 0.9 % IV BOLUS
1000.0000 mL | Freq: Once | INTRAVENOUS | Status: AC
  Administered 2022-06-24: 1000 mL via INTRAVENOUS

## 2022-06-24 NOTE — Progress Notes (Signed)
Received phone call from the emergency room that patient needs a Zio monitor 2-week and then follow-up in our clinic to go over the results for near syncope.  Has had 3 episodes in the past 3 months  Being discharged from the ER today  Any provider  We will need to make sure monitor results available at the time of his appointment  Thx  TGollan    Zio monitor ordered

## 2022-06-24 NOTE — ED Provider Notes (Signed)
[facility] Provider Note    Event Date/Time   First MD Initiated Contact with Patient 06/24/22 [phone]     (approximate)  History   Chief Complaint: Dizziness  HPI  Jason Terry is a 42 y.o. male with no significant past medical history presents to the emergency department after near syncopal episode.  According to the patient this morning while eating breakfast he felt his heart rate dropped and became diaphoretic and lightheaded.  Patient states the episode lasted approximately 10 minutes and then he began feeling better.  States he began attempting to work however shortly after this felt very weak and fatigued so he came to the emergency department.  Patient states this is the third or fourth episode since October similar issues with the patient will all of a sudden become lightheaded vision will blur and the patient will become diaphoretic and nearly passed out.  He states prior to October he has never had this issue.  Currently patient appears well, reassuring vital signs, sinus on the cardiac monitor.  Patient states slight chest tightness this morning during event but none currently.  Physical Exam   Triage Vital Signs: ED Triage Vitals  Enc Vitals Group     BP 06/24/22 0851 (!) 131/90     Pulse Rate 06/24/22 0850 61     Resp 06/24/22 0850 20     Temp 06/24/22 0852 98.5 F (36.9 C)     Temp Source 06/24/22 0852 Oral     SpO2 06/24/22 0850 99 %     Weight 06/24/22 0848 170 lb (77.1 kg)     Height 06/24/22 0848 '5\' 7"'$  (1.702 m)     Head Circumference --      Peak Flow --      Pain Score 06/24/22 0852 0     Pain Loc --      Pain Edu? --      Excl. in [place]? --     Most recent vital signs: Vitals:   06/24/22 0851 06/24/22 0852  BP: (!) 131/90   Pulse:    Resp:    Temp:  98.5 F (36.9 C)  SpO2:      General: Awake, no distress.  CV:  Good peripheral perfusion.  Regular rate and rhythm  Resp:  Normal effort.  Equal breath sounds bilaterally.   Abd:  No distention.  Soft, nontender.  No rebound or guarding.   ED Results / Procedures / Treatments   EKG  EKG viewed and interpreted by myself shows a sinus rhythm at 58 bpm with a narrow QRS, normal axis, normal intervals, no concerning ST changes.   MEDICATIONS ORDERED IN ED: Medications  sodium chloride 0.9 % bolus 1,000 mL (1,000 mLs Intravenous New Bag/Given 06/24/22 0913)     IMPRESSION / MDM / ASSESSMENT AND PLAN / ED COURSE  I reviewed the triage vital signs and the nursing notes.  Patient's presentation is most consistent with acute presentation with potential threat to life or bodily function.  Patient presents emergency department for near syncopal episode.  States multiple episodes over the past 4 months, none prior to this.  Currently the patient appears well, no distress.  EKG is reassuring.  We will check labs including electrolytes and cardiac enzymes.  We will IV hydrate and continue to closely monitor the patient.  Given the patient's recurrent episodes of near syncope concern for possible intermittent symptomatic bradycardia.  Will continue to monitor on telemetry in the emergency department.  I  believe ultimately the patient would benefit from Holter monitoring if his emergency department workup is normal.  Differential would include symptomatic bradycardia, vasovagal event, dehydration.  Patient's workup is overall reassuring, chemistry is normal, troponin negative, CBC is normal, COVID/flu/RSV is negative.  Differential would still include intermittent bradycardia, vasovagal event less likely dehydration.  Patient has received a liter of fluids in the emergency department.  No significant bradycardia throughout the patient's evaluation in the emergency department [place] patient's heart rate will occasionally drop into the upper 50s.  Given the patient's symptoms we will refer to cardiology for further evaluation and likely Holter monitor.  Patient is agreeable to plan  of care.  Discussed return precautions.  FINAL CLINICAL IMPRESSION(S) / ED DIAGNOSES   Near syncope  Note:  This document was prepared using Dragon voice recognition software and may include unintentional dictation errors.   Harvest Dark, MD 06/24/22 1029

## 2022-06-24 NOTE — ED Triage Notes (Signed)
Patient reports near syncope this morning while eating; has happened twice before and was seen by PCP with negative workup other than Vitamin D deficiency.

## 2022-06-24 NOTE — Discharge Instructions (Signed)
Please call the number provided for cardiology to arrange a follow-up appointment for consideration of further workup such as a Holter monitor.  Return to the emergency department for any concerning symptoms such as further episodes of almost passing out, passing out, any chest pain or trouble breathing.

## 2022-06-28 DIAGNOSIS — R55 Syncope and collapse: Secondary | ICD-10-CM

## 2022-06-29 ENCOUNTER — Ambulatory Visit: Payer: Managed Care, Other (non HMO) | Admitting: Internal Medicine

## 2022-08-11 ENCOUNTER — Encounter: Payer: Self-pay | Admitting: Cardiology

## 2022-08-11 ENCOUNTER — Ambulatory Visit: Payer: Managed Care, Other (non HMO) | Attending: Internal Medicine | Admitting: Cardiology

## 2022-08-11 VITALS — BP 128/93 | HR 61 | Ht 67.0 in | Wt 176.0 lb

## 2022-08-11 DIAGNOSIS — E7800 Pure hypercholesterolemia, unspecified: Secondary | ICD-10-CM

## 2022-08-11 DIAGNOSIS — R079 Chest pain, unspecified: Secondary | ICD-10-CM

## 2022-08-11 DIAGNOSIS — R55 Syncope and collapse: Secondary | ICD-10-CM

## 2022-08-11 DIAGNOSIS — R072 Precordial pain: Secondary | ICD-10-CM | POA: Diagnosis not present

## 2022-08-11 DIAGNOSIS — R0683 Snoring: Secondary | ICD-10-CM

## 2022-08-11 MED ORDER — METOPROLOL TARTRATE 50 MG PO TABS: 50.0000 mg | ORAL_TABLET | Freq: Once | ORAL | 0 refills | Status: DC

## 2022-08-11 NOTE — Patient Instructions (Signed)
Medication Instructions:   Your physician recommends that you continue on your current medications as directed. Please refer to the Current Medication list given to you today.  *If you need a refill on your cardiac medications before your next appointment, please call your pharmacy*   Lab Work:  None Ordered  If you have labs (blood work) drawn today and your tests are completely normal, you will receive your results only by: MyChart Message (if you have MyChart) OR A paper copy in the mail If you have any lab test that is abnormal or we need to change your treatment, we will call you to review the results.   Testing/Procedures:  Your physician has requested that you have an echocardiogram. Echocardiography is a painless test that uses sound waves to create images of your heart. It provides your doctor with information about the size and shape of your heart and how well your heart's chambers and valves are working. This procedure takes approximately one hour. There are no restrictions for this procedure. Please do NOT wear cologne, perfume, aftershave, or lotions (deodorant is allowed). Please arrive 15 minutes prior to your appointment time.    Your cardiac CT will be scheduled on May 6th, 2024 @ 10:15 am  [facility] [address] [place], [place] [address] [phone]  If scheduled at [facility] or [facility], please arrive 15 mins early for check-in and test prep.   Please follow these instructions carefully (unless otherwise directed):  Hold all erectile dysfunction medications at least 3 days (72 hrs) prior to test. (Ie viagra, cialis, sildenafil, tadalafil, etc) We will administer nitroglycerin during this exam.   On the Night Before the Test: Be sure to Drink plenty of water. Do not consume any caffeinated/decaffeinated beverages or chocolate 12 hours prior to your  test. Do not take any antihistamines 12 hours prior to your test.  On the Day of the Test: Drink plenty of water until 1 hour prior to the test. Do not eat any food 1 hour prior to test. You may take your regular medications prior to the test.  Take metoprolol (Lopressor) 50 mg two hours prior to test.     After the Test: Drink plenty of water. After receiving IV contrast, you may experience a mild flushed feeling. This is normal. On occasion, you may experience a mild rash up to 24 hours after the test. This is not dangerous. If this occurs, you can take Benadryl 25 mg and increase your fluid intake. If you experience trouble breathing, this can be serious. If it is severe call 911 IMMEDIATELY. If it is mild, please call our office.  Follow-Up: At [facility], you and your health needs are our priority.  As part of our continuing mission to provide you with exceptional heart care, we have created designated Provider Care Teams.  These Care Teams include your primary Cardiologist (physician) and Advanced Practice Providers (APPs -  Physician Assistants and Nurse Practitioners) who all work together to provide you with the care you need, when you need it.  We recommend signing up for the patient portal called "MyChart".  Sign up information is provided on this After Visit Summary.  MyChart is used to connect with patients for Virtual Visits (Telemedicine).  Patients are able to view lab/test results, encounter notes, upcoming appointments, etc.  Non-urgent messages can be sent to your provider as well.   To learn more about what you  can do with MyChart, go to [email or web address].    Your next appointment:    After testing  Provider:   Debbe Odea, MD ONLY

## 2022-08-11 NOTE — Addendum Note (Signed)
Addended by: Auburn Bilberry D on: 08/11/2022 04:38 PM   Modules accepted: Orders

## 2022-08-11 NOTE — Progress Notes (Signed)
Cardiology Office Note:    Date:  08/11/2022   ID:  Jason Terry, DOB 07/12/1980, MRN [id number]  PCP:  Jason Terry   [facility] Providers Cardiologist:  Debbe Odea, MD     Referring MD: Larena Glassman, PA   Chief Complaint  Patient presents with   New Patient (Initial Visit)    Palpitations, Diziness, Family Hx    Jason Terry is a 42 y.o. male who is being seen today for the evaluation of palpitations at the request of Larena Glassman, [place].   History of Present Illness:    Jason Terry is a 42 y.o. male with a hx of hyperlipidemia who presents due to presyncope and chest pain.  States having symptoms of dizziness, head fogginess ongoing over the past 6 months.  Symptoms occur about 3 times, while at work.  He states suddenly feeling lightheaded, nauseous and left-sided chest discomfort.  He presented to the ED 2 months ago due to a similar episode.  Workup with EKG was unrevealing.  Denies any family history of heart disease or heart attacks.  His cholesterol is abnormal which she attributes to eating poorly.  His heart rate typically runs low.  Patient endorsed snoring, daytime fatigue, somnolence.  Past Medical History:  Diagnosis Date   Kidney stones     History reviewed. No pertinent surgical history.  Current Medications: Current Meds  Medication Sig   metoprolol tartrate (LOPRESSOR) 50 MG tablet Take 1 tablet (50 mg total) by mouth once for 1 dose. TWO HOURS PRIOR TO CARDIAC CTA     Allergies:   Patient has no known allergies.   Social History   Socioeconomic History   Marital status: Single    Spouse name: Not on file   Number of children: Not on file   Years of education: Not on file   Highest education level: Not on file  Occupational History   Not on file  Tobacco Use   Smoking status: Some Days    Types: E-cigarettes   Smokeless tobacco: Current    Types: Chew  Vaping Use   Vaping Use: Never used  Substance and Sexual  Activity   Alcohol use: Yes    Comment: occasionally   Drug use: Never   Sexual activity: Not on file  Other Topics Concern   Not on file  Social History Narrative   Not on file   Social Determinants of Health   Financial Resource Strain: Not on file  Food Insecurity: Not on file  Transportation Needs: Not on file  Physical Activity: Not on file  Stress: Not on file  Social Connections: Not on file     Family History: The patient's family history includes High blood pressure in his father.  ROS:   Please see the history of present illness.     All other systems reviewed and are negative.  EKGs/Labs/Other Studies Reviewed:    The following studies were reviewed today:   EKG:  EKG is  ordered today.  The ekg ordered today demonstrates normal sinus rhythm, normal ECG.  Recent Labs: 06/24/2022: ALT 31; BUN 18; Creatinine, Ser 0.90; Hemoglobin 15.5; Platelets 212; Potassium 3.6; Sodium 135  Recent Lipid Panel    Component Value Date/Time   CHOL 275 (H) 02/04/2020 1444   TRIG 149 02/04/2020 1444   HDL 51 02/04/2020 1444   CHOLHDL 5.4 (H) 02/04/2020 1444   LDLCALC 196 (H) 02/04/2020 1444     Risk Assessment/Calculations:     HYPERTENSION CONTROL Vitals:  08/11/22 0834 08/11/22 0835  BP: 125/88 (!) 128/93    The patient's blood pressure is elevated above target today.  In order to address the patient's elevated BP: Blood pressure will be monitored at home to determine if medication changes need to be made.            Physical Exam:    VS:  BP (!) 128/93 (BP Location: Right Arm)   Pulse 61   Ht 5\' 7"  (1.702 m)   Wt 176 lb (79.8 kg)   SpO2 98%   BMI 27.57 kg/m     Wt Readings from Last 3 Encounters:  08/11/22 176 lb (79.8 kg)  06/24/22 170 lb (77.1 kg)  02/04/20 168 lb (76.2 kg)     GEN:  Well nourished, well developed in no acute distress HEENT: Normal NECK: No JVD; No carotid bruits CARDIAC: RRR, no murmurs, rubs, gallops RESPIRATORY:  Clear  to auscultation without rales, wheezing or rhonchi  ABDOMEN: Soft, non-tender, non-distended MUSCULOSKELETAL:  No edema; No deformity  SKIN: Warm and dry NEUROLOGIC:  Alert and oriented x 3 PSYCHIATRIC:  Normal affect   ASSESSMENT:    1. Precordial pain   2. Pure hypercholesterolemia   3. Snoring   4. Pre-syncope   5. Chest pain, unspecified type    PLAN:    In order of problems listed above:  Chest pain, history of hyperlipidemia.  Get echo, get coronary CTA. Hyperlipidemia, low-cholesterol diet advised.  Advised to fax [place] repeat lipid panel when obtained from PCP in 2 weeks. Snoring, daytime fatigue, somnolence.  Refer to sleep specialist for OSA eval. Dizziness, presyncope.  2-week cardiac monitor showed no significant arrhythmias.  Cardiac testing as above.  If symptoms persist, will refer to EP referral for ILR consideration.  Follow-up in 6 weeks after testing      Medication Adjustments/Labs and Tests Ordered: Current medicines are reviewed at length with the patient today.  Concerns regarding medicines are outlined above.  Orders Placed This Encounter  Procedures   CT CORONARY MORPH W/CTA COR W/SCORE W/CA W/CM &/OR WO/CM   Ambulatory referral to Pulmonology   ECHOCARDIOGRAM COMPLETE   Meds ordered this encounter  Medications   metoprolol tartrate (LOPRESSOR) 50 MG tablet    Sig: Take 1 tablet (50 mg total) by mouth once for 1 dose. TWO HOURS PRIOR TO CARDIAC CTA    Dispense:  1 tablet    Refill:  0    Patient Instructions  Medication Instructions:   Your physician recommends that you continue on your current medications as directed. Please refer to the Current Medication list given to you today.  *If you need a refill on your cardiac medications before your next appointment, please call your pharmacy*   Lab Work:  None Ordered  If you have labs (blood work) drawn today and your tests are completely normal, you will receive your results only by: MyChart  Message (if you have MyChart) OR A paper copy in the mail If you have any lab test that is abnormal or we need to change your treatment, we will call you to review the results.   Testing/Procedures:  Your physician has requested that you have an echocardiogram. Echocardiography is a painless test that uses sound waves to create images of your heart. It provides your doctor with information about the size and shape of your heart and how well your heart's chambers and valves are working. This procedure takes approximately one hour. There are no restrictions for this  procedure. Please do NOT wear cologne, perfume, aftershave, or lotions (deodorant is allowed). Please arrive 15 minutes prior to your appointment time.    Your cardiac CT will be scheduled on May 6th, 2024 @ 10:15 am  [facility] [address] [place], [place] [address] [phone]  If scheduled at [facility] or [facility], please arrive 15 mins early for check-in and test prep.   Please follow these instructions carefully (unless otherwise directed):  Hold all erectile dysfunction medications at least 3 days (72 hrs) prior to test. (Ie viagra, cialis, sildenafil, tadalafil, etc) We will administer nitroglycerin during this exam.   On the Night Before the Test: Be sure to Drink plenty of water. Do not consume any caffeinated/decaffeinated beverages or chocolate 12 hours prior to your test. Do not take any antihistamines 12 hours prior to your test.  On the Day of the Test: Drink plenty of water until 1 hour prior to the test. Do not eat any food 1 hour prior to test. You may take your regular medications prior to the test.  Take metoprolol (Lopressor) 50 mg two hours prior to test.     After the Test: Drink plenty of water. After receiving IV contrast, you may experience a mild flushed feeling. This is normal. On  occasion, you may experience a mild rash up to 24 hours after the test. This is not dangerous. If this occurs, you can take Benadryl 25 mg and increase your fluid intake. If you experience trouble breathing, this can be serious. If it is severe call 911 IMMEDIATELY. If it is mild, please call our office.  Follow-Up: At [facility], you and your health needs are our priority.  As part of our continuing mission to provide you with exceptional heart care, we have created designated Provider Care Teams.  These Care Teams include your primary Cardiologist (physician) and Advanced Practice Providers (APPs -  Physician Assistants and Nurse Practitioners) who all work together to provide you with the care you need, when you need it.  We recommend signing up for the patient portal called "MyChart".  Sign up information is provided on this After Visit Summary.  MyChart is used to connect with patients for Virtual Visits (Telemedicine).  Patients are able to view lab/test results, encounter notes, upcoming appointments, etc.  Non-urgent messages can be sent to your provider as well.   To learn more about what you can do with MyChart, go to [email or web address].    Your next appointment:    After testing  Provider:   Debbe OdeaBrian Agbor-Etang, MD ONLY      Signed, Debbe OdeaBrian Agbor-Etang, MD  08/11/2022 10:26 AM    [facility]

## 2022-08-25 ENCOUNTER — Ambulatory Visit: Payer: Managed Care, Other (non HMO) | Attending: Internal Medicine

## 2022-08-25 DIAGNOSIS — R072 Precordial pain: Secondary | ICD-10-CM

## 2022-08-25 LAB — ECHOCARDIOGRAM COMPLETE
AR max vel: 3.61 cm2
AV Area VTI: 3.39 cm2
AV Area mean vel: 3.63 cm2
AV Mean grad: 2 mmHg
AV Peak grad: 3.8 mmHg
Ao pk vel: 0.97 m/s
Area-P 1/2: 4.68 cm2
Calc EF: 55.5 %
S' Lateral: 3.2 cm
Single Plane A2C EF: 55 %
Single Plane A4C EF: 55.1 %

## 2022-09-02 ENCOUNTER — Telehealth (HOSPITAL_COMMUNITY): Payer: Self-pay | Admitting: *Deleted

## 2022-09-02 NOTE — Telephone Encounter (Signed)
Reaching out to patient to offer assistance regarding upcoming cardiac imaging study; pt verbalizes understanding of appt date/time, parking situation and where to check in, pre-test NPO status and medications ordered, and verified current allergies; name and call back number provided for further questions should they arise  Jason Brick RN Navigator Cardiac Imaging [facility] [phone] office [phone] cell  Patient to take 50mg  metoprolol tartrate if his HR is greater than 65bpm. Reports HR is generally in the 50's.

## 2022-09-05 ENCOUNTER — Ambulatory Visit
Admission: RE | Admit: 2022-09-05 | Discharge: 2022-09-05 | Disposition: A | Discharge status: Home / Self Care | Payer: Managed Care, Other (non HMO) | Source: Ambulatory Visit | Attending: Internal Medicine | Admitting: Internal Medicine

## 2022-09-05 DIAGNOSIS — R079 Chest pain, unspecified: Secondary | ICD-10-CM | POA: Diagnosis not present

## 2022-09-05 MED ORDER — NITROGLYCERIN 0.4 MG SL SUBL
0.8000 mg | SUBLINGUAL_TABLET | Freq: Once | SUBLINGUAL | Status: AC
  Administered 2022-09-05: 0.8 mg via SUBLINGUAL
  Filled 2022-09-05: qty 25

## 2022-09-05 MED ORDER — IOHEXOL 350 MG/ML SOLN
100.0000 mL | Freq: Once | INTRAVENOUS | Status: AC | PRN
  Administered 2022-09-05: 100 mL via INTRAVENOUS

## 2022-09-05 NOTE — Progress Notes (Signed)
Patient tolerated procedure well. Ambulate w/o difficulty. Denies any lightheadedness or being dizzy. Pt denies any pain at this time.  Pt is encouraged to drink additional water throughout the day and reason explained to patient. Patient verbalized understanding and all questions answered. ABC intact. No further needs at this time. Discharge from procedure area w/o issues. 

## 2022-09-16 ENCOUNTER — Ambulatory Visit: Payer: Managed Care, Other (non HMO) | Admitting: Cardiology

## 2022-09-16 ENCOUNTER — Encounter: Payer: Self-pay | Admitting: Cardiology

## 2022-09-16 ENCOUNTER — Ambulatory Visit: Payer: Managed Care, Other (non HMO) | Attending: Cardiology | Admitting: Cardiology

## 2022-09-16 VITALS — BP 130/94 | HR 81 | Ht 67.0 in | Wt 174.0 lb

## 2022-09-16 DIAGNOSIS — R072 Precordial pain: Secondary | ICD-10-CM

## 2022-09-16 DIAGNOSIS — E7800 Pure hypercholesterolemia, unspecified: Secondary | ICD-10-CM | POA: Diagnosis not present

## 2022-09-16 DIAGNOSIS — R55 Syncope and collapse: Secondary | ICD-10-CM

## 2022-09-16 MED ORDER — ATORVASTATIN CALCIUM 40 MG PO TABS: 40.0000 mg | ORAL_TABLET | Freq: Every day | ORAL | 3 refills | Status: DC

## 2022-09-16 NOTE — Patient Instructions (Signed)
Medication Instructions:   START Atorvastatin - Take one tablet ( 40mg ) by mouth daily.   *If you need a refill on your cardiac medications before your next appointment, please call your pharmacy*   Lab Work:  Your physician recommends that you return for lab work in:[facility] mall. You will need to be fasting.  No appt is needed. Hours are M-F 7AM- 6 PM.  If you have labs (blood work) drawn today and your tests are completely normal, you will receive your results only by: MyChart Message (if you have MyChart) OR A paper copy in the mail If you have any lab test that is abnormal or we need to change your treatment, we will call you to review the results.   Testing/Procedures:  None Ordered    Follow-Up: At [facility], you and your health needs are our priority.  As part of our continuing mission to provide you with exceptional heart care, we have created designated Provider Care Teams.  These Care Teams include your primary Cardiologist (physician) and Advanced Practice Providers (APPs -  Physician Assistants and Nurse Practitioners) who all work together to provide you with the care you need, when you need it.  We recommend signing up for the patient portal called "MyChart".  Sign up information is provided on this After Visit Summary.  MyChart is used to connect with patients for Virtual Visits (Telemedicine).  Patients are able to view lab/test results, encounter notes, upcoming appointments, etc.  Non-urgent messages can be sent to your provider as well.   To learn more about what you can do with MyChart, go to [email or web address].    Your next appointment:   3 month(s)  Provider:   You may see Debbe Odea, MD or one of the following Advanced Practice Providers on your designated Care Team:   Nicolasa Ducking, NP Eula Listen, PA-C Cadence Fransico Manny, PA-C Charlsie Quest, NP

## 2022-09-16 NOTE — Progress Notes (Signed)
Cardiology Office Note:    Date:  09/16/2022   ID:  Jason Terry, DOB May 30, 1980, MRN [id number]  PCP:  Aviva Kluver   [facility] Providers Cardiologist:  Debbe Odea, MD     Referring MD: No ref. provider found   Chief Complaint  Patient presents with   Follow-up    Patient denies new or acute cardiac problems/concerns today.      History of Present Illness:    Jason Terry is a 42 y.o. male with a hx of hyperlipidemia who presents for follow-up.  Previously seen due to chest pain and hyperlipidemia.  Echo and coronary CT was obtained to evaluate cardiac etiology.  Previously seen for dizziness, cardiac monitor was unrevealing.  States symptoms of dizziness improved after drinking water.  Denies any family history of high cholesterol.    Past Medical History:  Diagnosis Date   Kidney stones     History reviewed. No pertinent surgical history.  Current Medications: Current Meds  Medication Sig   atorvastatin (LIPITOR) 40 MG tablet Take 1 tablet (40 mg total) by mouth daily.   Vitamin D, Ergocalciferol, (DRISDOL) 1.25 MG (50000 UNIT) CAPS capsule Take 50,000 Units by mouth every 7 (seven) days.     Allergies:   Patient has no known allergies.   Social History   Socioeconomic History   Marital status: Single    Spouse name: Not on file   Number of children: Not on file   Years of education: Not on file   Highest education level: Not on file  Occupational History   Not on file  Tobacco Use   Smoking status: Some Days    Types: E-cigarettes   Smokeless tobacco: Current    Types: Chew  Vaping Use   Vaping Use: Never used  Substance and Sexual Activity   Alcohol use: Yes    Comment: occasionally   Drug use: Never   Sexual activity: Not on file  Other Topics Concern   Not on file  Social History Narrative   Not on file   Social Determinants of Health   Financial Resource Strain: Not on file  Food Insecurity: Not on file   Transportation Needs: Not on file  Physical Activity: Not on file  Stress: Not on file  Social Connections: Not on file     Family History: The patient's family history includes High blood pressure in his father.  ROS:   Please see the history of present illness.     All other systems reviewed and are negative.  EKGs/Labs/Other Studies Reviewed:    The following studies were reviewed today:   EKG:  EKG not ordered today.   Recent Labs: 06/24/2022: ALT 31; BUN 18; Creatinine, Ser 0.90; Hemoglobin 15.5; Platelets 212; Potassium 3.6; Sodium 135  Recent Lipid Panel    Component Value Date/Time   CHOL 275 (H) 02/04/2020 1444   TRIG 149 02/04/2020 1444   HDL 51 02/04/2020 1444   CHOLHDL 5.4 (H) 02/04/2020 1444   LDLCALC 196 (H) 02/04/2020 1444   Recently obtained outside cholesterol numbers/lipid panel showed total cholesterol 313, LDL 212, triglycerides 167,  Risk Assessment/Calculations:    Physical Exam:    VS:  BP (!) 130/94 (BP Location: Left Arm, Patient Position: Sitting, Cuff Size: Normal)   Pulse 81   Ht 5\' 7"  (1.702 m)   Wt 174 lb (78.9 kg)   SpO2 95%   BMI 27.25 kg/m     Wt Readings from Last 3 Encounters:  09/16/22  174 lb (78.9 kg)  08/11/22 176 lb (79.8 kg)  06/24/22 170 lb (77.1 kg)     GEN:  Well nourished, well developed in no acute distress HEENT: Normal NECK: No JVD; No carotid bruits CARDIAC: RRR, no murmurs, rubs, gallops RESPIRATORY:  Clear to auscultation without rales, wheezing or rhonchi  ABDOMEN: Soft, non-tender, non-distended MUSCULOSKELETAL:  No edema; No deformity  SKIN: Warm and dry NEUROLOGIC:  Alert and oriented x 3 PSYCHIATRIC:  Normal affect   ASSESSMENT:    1. Precordial pain   2. Pure hypercholesterolemia   3. Pre-syncope     PLAN:    In order of problems listed above:  Chest pain, echo with normal EF 60 to 65%, coronary CTA with no CAD. Hyperlipidemia, LDL 212, suggesting FH.  Start Lipitor 40 mg daily.  Check  lipid panel in 3 months. Presyncope, dizziness, improved after hydration.  Cardiac monitor with no significant arrhythmias.  Etiology likely from dehydration  Follow-up in 3 months after repeat lipid panel.      Medication Adjustments/Labs and Tests Ordered: Current medicines are reviewed at length with the patient today.  Concerns regarding medicines are outlined above.  Orders Placed This Encounter  Procedures   Lipid panel   Meds ordered this encounter  Medications   atorvastatin (LIPITOR) 40 MG tablet    Sig: Take 1 tablet (40 mg total) by mouth daily.    Dispense:  90 tablet    Refill:  3    Patient Instructions  Medication Instructions:   START Atorvastatin - Take one tablet ( 40mg ) by mouth daily.   *If you need a refill on your cardiac medications before your next appointment, please call your pharmacy*   Lab Work:  Your physician recommends that you return for lab work in:[facility] mall. You will need to be fasting.  No appt is needed. Hours are M-F 7AM- 6 PM.  If you have labs (blood work) drawn today and your tests are completely normal, you will receive your results only by: MyChart Message (if you have MyChart) OR A paper copy in the mail If you have any lab test that is abnormal or we need to change your treatment, we will call you to review the results.   Testing/Procedures:  None Ordered    Follow-Up: At [facility], you and your health needs are our priority.  As part of our continuing mission to provide you with exceptional heart care, we have created designated Provider Care Teams.  These Care Teams include your primary Cardiologist (physician) and Advanced Practice Providers (APPs -  Physician Assistants and Nurse Practitioners) who all work together to provide you with the care you need, when you need it.  We recommend signing up for the patient portal called "MyChart".  Sign up information is provided on this After Visit  Summary.  MyChart is used to connect with patients for Virtual Visits (Telemedicine).  Patients are able to view lab/test results, encounter notes, upcoming appointments, etc.  Non-urgent messages can be sent to your provider as well.   To learn more about what you can do with MyChart, go to [email or web address].    Your next appointment:   3 month(s)  Provider:   You may see Debbe Odea, MD or one of the following Advanced Practice Providers on your designated Care Team:   Nicolasa Ducking, NP Eula Listen, PA-C Cadence Fransico [place]um[place], PA-C Charlsie Quest, NP   Signed, Debbe Odea, MD  09/16/2022 9:26 AM  [facility]

## 2022-12-21 ENCOUNTER — Ambulatory Visit: Payer: Managed Care, Other (non HMO) | Attending: Cardiology | Admitting: Cardiology

## 2022-12-22 ENCOUNTER — Encounter: Payer: Self-pay | Admitting: Cardiology

## 2023-12-25 ENCOUNTER — Other Ambulatory Visit: Payer: Self-pay | Admitting: Cardiology

## 2023-12-26 NOTE — Telephone Encounter (Signed)
 Please contact pt to schedule future appointment. Pt overdue for follow up.

## 2023-12-26 NOTE — Telephone Encounter (Signed)
 Pt said that he does not want to schedule an appointment.

## 2024-01-23 ENCOUNTER — Other Ambulatory Visit: Payer: Self-pay | Admitting: Physician Assistant

## 2024-01-23 ENCOUNTER — Ambulatory Visit
Admission: RE | Admit: 2024-01-23 | Discharge: 2024-01-23 | Disposition: A | Discharge status: Home / Self Care | Source: Ambulatory Visit | Attending: Physician Assistant | Admitting: Physician Assistant

## 2024-01-23 DIAGNOSIS — M549 Dorsalgia, unspecified: Secondary | ICD-10-CM
